# Patient Record
Sex: Female | Born: 2005 | Race: White | Hispanic: No | Marital: Single | Smoking: Never smoker
Health system: Southern US, Community
[De-identification: ages and names within clinical notes are randomized; demographics above are authoritative.]

---
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
## 2023-10-04 ENCOUNTER — Ambulatory Visit
Admission: EM | Admit: 2023-10-04 | Discharge: 2023-10-04 | Disposition: A | Discharge status: Home / Self Care | Attending: Nurse Practitioner | Admitting: Nurse Practitioner

## 2023-10-04 ENCOUNTER — Other Ambulatory Visit: Payer: Self-pay

## 2023-10-04 ENCOUNTER — Encounter: Payer: Self-pay | Admitting: Emergency Medicine

## 2023-10-04 DIAGNOSIS — E860 Dehydration: Secondary | ICD-10-CM

## 2023-10-04 DIAGNOSIS — R112 Nausea with vomiting, unspecified: Secondary | ICD-10-CM | POA: Diagnosis not present

## 2023-10-04 DIAGNOSIS — A084 Viral intestinal infection, unspecified: Secondary | ICD-10-CM | POA: Diagnosis not present

## 2023-10-04 LAB — POCT URINE PREGNANCY: Preg Test, Ur: NEGATIVE

## 2023-10-04 LAB — POCT URINALYSIS DIP (MANUAL ENTRY)
Blood, UA: NEGATIVE
Glucose, UA: NEGATIVE mg/dL
Leukocytes, UA: NEGATIVE
Nitrite, UA: NEGATIVE
Protein Ur, POC: 30 mg/dL — AB
Spec Grav, UA: >=1.03 — AB
Urobilinogen, UA: 1 U/dL
pH, UA: 6

## 2023-10-04 MED ORDER — ONDANSETRON 4 MG PO TBDP: 4.0000 mg | ORAL_TABLET | Freq: Three times a day (TID) | ORAL | 0 refills | Status: AC | PRN

## 2023-10-04 MED ORDER — SODIUM CHLORIDE 0.9 % IV BOLUS
1000.0000 mL | Freq: Once | INTRAVENOUS | Status: AC
  Administered 2023-10-04: 1000 mL via INTRAVENOUS

## 2023-10-04 MED ORDER — ONDANSETRON 4 MG PO TBDP
4.0000 mg | ORAL_TABLET | Freq: Once | ORAL | Status: AC
  Administered 2023-10-04: 4 mg via ORAL

## 2023-10-04 NOTE — ED Triage Notes (Signed)
 Pt here for upper abd pain with radiation into back starting this am with vomiting; denies urinary sx

## 2023-10-04 NOTE — ED Provider Notes (Signed)
 [facility]    CSN: [id number] Arrival date & time: 10/04/23  1938      History   Chief Complaint Chief Complaint  Patient presents with   Abdominal Pain    HPI Natalie Christian is a 18 y.o. female.   Patient presents today with 24-hour history of persistent nausea and vomiting.  Reports that she has been unable to tolerate any oral intake for the past 12 hours and as result has had decreased urinary output.  She denies any urinary symptoms including frequency, urgency, hematuria.  She does report that her uncle was sick with similar symptoms several days before.  Denies additional sick contacts.  Denies any recent travel.  Denies any recent medication changes or antibiotic use.  Denies any suspicious food intake.  She reports upper abdominal and upper back pain which she believes is a result of the retching/vomiting.  She reports that the pain began after she developed GI symptoms and not before.  Pain is rated 4/5 on a 0-10 pain scale, described as aching/soreness, no alleviating factors identified.  She does not take NSAIDs on a regular basis.  Reports her last bowel movement was earlier today and was normal.  Denies any associated melena, hematochezia, hematemesis.  She denies history of gastrointestinal disorder.  Denies previous abdominal surgery.    History reviewed. No pertinent past medical history.  There are no active problems to display for this patient.   History reviewed. No pertinent surgical history.  OB History   No obstetric history on file.      Home Medications    Prior to Admission medications   Medication Sig Start Date End Date Taking? Authorizing Provider  ondansetron (ZOFRAN-ODT) 4 MG disintegrating tablet Take 1 tablet (4 mg total) by mouth every 8 (eight) hours as needed for nausea or vomiting. 10/04/23  Yes Sophiea Ueda K, PA-C  escitalopram (LEXAPRO) 10 MG tablet Take 10 mg by mouth daily.    [provider]  guaiFENesin-codeine  (CHERATUSSIN AC) 100-10 MG/5ML syrup Take 5 mLs by mouth 4 (four) times daily as needed for cough. 09/19/21   Ann Keto, MD  methocarbamol  (ROBAXIN ) 500 MG tablet Take 1 tablet (500 mg total) by mouth 2 (two) times daily as needed for muscle spasms. 08/13/22   Dodson Freestone, FNP    Family History History reviewed. No pertinent family history.  Social History Social History   Tobacco Use   Smoking status: Never   Smokeless tobacco: Never  Vaping Use   Vaping status: Never Used  Substance Use Topics   Alcohol use: Never   Drug use: Never     Allergies   Patient has no known allergies.   Review of Systems Review of Systems  Constitutional:  Positive for activity change. Negative for appetite change, fatigue and fever.  HENT:  Negative for congestion and sore throat.   Respiratory:  Negative for shortness of breath.   Cardiovascular:  Negative for chest pain.  Gastrointestinal:  Positive for abdominal pain, nausea and vomiting. Negative for diarrhea.  Musculoskeletal:  Positive for back pain. Negative for arthralgias and myalgias.  Neurological:  Negative for dizziness, light-headedness and headaches.     Physical Exam Triage Vital Signs ED Triage Vitals  Encounter Vitals Group     BP 10/04/23 2015 115/75     Systolic BP Percentile --      Diastolic BP Percentile --      Pulse Rate 10/04/23 2015 (!) 120  Resp 10/04/23 2015 18     Temp 10/04/23 2015 98.6 F (37 C)     Temp Source 10/04/23 2015 Oral     SpO2 10/04/23 2015 97 %     Weight --      Height --      Head Circumference --      Peak Flow --      Pain Score 10/04/23 2016 7     Pain Loc --      Pain Education --      Exclude from Growth Chart --    No data found.  Updated Vital Signs BP 115/75 (BP Location: Right Arm)   Pulse (!) 105   Temp 98.6 F (37 C) (Oral)   Resp 18   SpO2 97%   Visual Acuity Right Eye Distance:   Left Eye Distance:   Bilateral Distance:    Right Eye Near:    Left Eye Near:    Bilateral Near:     Physical Exam Vitals reviewed.  Constitutional:      General: She is awake. She is not in acute distress.    Appearance: Normal appearance. She is well-developed. She is not ill-appearing.     Comments: Very pleasant female appears stated age in no acute distress sitting comfortably in exam room holding emesis bag  HENT:     Head: Normocephalic and atraumatic.     Mouth/Throat:     Mouth: Mucous membranes are moist.     Pharynx: No oropharyngeal exudate or posterior oropharyngeal erythema.  Cardiovascular:     Rate and Rhythm: Regular rhythm. Tachycardia present.     Heart sounds: Normal heart sounds, S1 normal and S2 normal. No murmur heard. Pulmonary:     Effort: Pulmonary effort is normal.     Breath sounds: Normal breath sounds. No wheezing, rhonchi or rales.     Comments: Clear to auscultation bilaterally Abdominal:     General: Bowel sounds are normal.     Palpations: Abdomen is soft.     Tenderness: There is abdominal tenderness in the right upper quadrant, epigastric area and left upper quadrant. There is no right CVA tenderness, left CVA tenderness, guarding or rebound. Negative signs include Murphy's sign.     Comments: Mild tenderness palpation throughout upper abdomen.  No evidence of acute abdomen on physical exam.  Negative Murphy sign.  No focal tenderness.  Psychiatric:        Behavior: Behavior is cooperative.      UC Treatments / Results  Labs (all labs ordered are listed, but only abnormal results are displayed) Labs Reviewed  POCT URINALYSIS DIP (MANUAL ENTRY) - Abnormal; Notable for the following components:      Result Value   Clarity, UA cloudy (*)    Bilirubin, UA small (*)    Ketones, POC UA moderate (40) (*)    Spec Grav, UA >=1.030 (*)    Protein Ur, POC =30 (*)    All other components within normal limits  POCT URINE PREGNANCY - Normal  CBC WITH DIFFERENTIAL/PLATELET  COMPREHENSIVE METABOLIC PANEL WITH  GFR  LIPASE    EKG   Radiology No results found.  Procedures Procedures (including critical care time)  Medications Ordered in UC Medications  ondansetron (ZOFRAN-ODT) disintegrating tablet 4 mg (4 mg Oral Given 10/04/23 2042)  sodium chloride 0.9 % bolus 1,000 mL (1,000 mLs Intravenous New Bag/Given 10/04/23 2049)    Initial Impression / Assessment and Plan / UC Course  I have reviewed  the triage vital signs and the nursing notes.  Pertinent labs & imaging results that were available during my care of the patient were reviewed by me and considered in my medical decision making (see chart for details).     Patient was well-appearing, afebrile, nontoxic.  She was significantly tachycardic but noted to be dehydrated based on her UA.  She was given 1 L of saline in clinic with improvement of symptoms normalization of her heart rate.  Urine pregnancy was negative.  Suspect gastroenteritis as etiology of symptoms given short duration.  She was given Zofran and able to pass oral challenge following rehydration therapy in clinic.  She was sent home with this medication to be used on a scheduled basis for the next several days and then decrease use to as needed thereafter.  Recommend she eat a bland diet and drink plenty of fluid.  Recommended close follow-up with her primary care ideally within the next few days to ensure that she is continuing to improve.  Basic blood work including lipase, CBC,, CMP obtained and are pending.  We will contact her if these are abnormal and change our treatment plan.  We discussed that if anything worsen she should have a low threshold for going to the hospital including severe abdominal pain, fever, nausea/vomiting interfering with oral intake, nausea/vomiting despite the medication, melena/hematochezia she needs to be seen immediately.  Strict return precautions given.  Excuse note provided.  Final Clinical Impressions(s) / UC Diagnoses   Final diagnoses:   Nausea and vomiting, unspecified vomiting type  Dehydration  Viral gastroenteritis     Discharge Instructions      You were significantly dehydrated but we were able to give you a bag of fluids.  It is very important that you use Zofran on a scheduled basis for the next 2 days to ensure that you are able to eat and drink normally.  After that you can use it every 8 hours as needed.  Eat a bland diet such as the brat diet (bananas, rice, applesauce, toast) and make sure that you are drinking plenty of fluid.  I will contact you if any of your blood work is abnormal.  Follow-up with your primary care within the next few days for recheck.  If anything worsens you have severe abdominal pain, recurrent nausea/vomiting despite the medication, blood in your stool, blood in your vomit, weakness you need to go to the emergency room immediately.   ED Prescriptions     Medication Sig Dispense Auth. Provider   ondansetron (ZOFRAN-ODT) 4 MG disintegrating tablet Take 1 tablet (4 mg total) by mouth every 8 (eight) hours as needed for nausea or vomiting. 20 tablet Nusayba Cadenas K, PA-C      PDMP not reviewed this encounter.   Budd Cargo, PA-C 10/04/23 2122

## 2023-10-04 NOTE — Discharge Instructions (Signed)
 You were significantly dehydrated but we were able to give you a bag of fluids.  It is very important that you use Zofran on a scheduled basis for the next 2 days to ensure that you are able to eat and drink normally.  After that you can use it every 8 hours as needed.  Eat a bland diet such as the brat diet (bananas, rice, applesauce, toast) and make sure that you are drinking plenty of fluid.  I will contact you if any of your blood work is abnormal.  Follow-up with your primary care within the next few days for recheck.  If anything worsens you have severe abdominal pain, recurrent nausea/vomiting despite the medication, blood in your stool, blood in your vomit, weakness you need to go to the emergency room immediately.

## 2023-10-06 ENCOUNTER — Ambulatory Visit (HOSPITAL_COMMUNITY): Payer: Self-pay

## 2023-10-06 LAB — COMPREHENSIVE METABOLIC PANEL WITH GFR
ALT: 16 IU/L (ref 0–32)
AST: 17 IU/L (ref 0–40)
Albumin: 4.9 g/dL (ref 4.0–5.0)
Alkaline Phosphatase: 91 IU/L (ref 42–106)
BUN/Creatinine Ratio: 19 (ref 9–23)
BUN: 12 mg/dL (ref 6–20)
Bilirubin Total: 0.6 mg/dL (ref 0.0–1.2)
CO2: 21 mmol/L (ref 20–29)
Calcium: 9.7 mg/dL (ref 8.7–10.2)
Chloride: 102 mmol/L (ref 96–106)
Creatinine, Ser: 0.63 mg/dL (ref 0.57–1.00)
Globulin, Total: 2.5 g/dL (ref 1.5–4.5)
Glucose: 94 mg/dL (ref 70–99)
Potassium: 4.1 mmol/L (ref 3.5–5.2)
Sodium: 139 mmol/L (ref 134–144)
Total Protein: 7.4 g/dL (ref 6.0–8.5)
eGFR: 131 mL/min/{1.73_m2} (ref >59)

## 2023-10-06 LAB — CBC WITH DIFFERENTIAL/PLATELET
Basophils Absolute: 0 10*3/uL (ref 0.0–0.2)
Basos: 0 %
EOS (ABSOLUTE): 0 10*3/uL (ref 0.0–0.4)
Eos: 0 %
Hematocrit: 44.5 % (ref 34.0–46.6)
Hemoglobin: 13.9 g/dL (ref 11.1–15.9)
Immature Grans (Abs): 0 10*3/uL (ref 0.0–0.1)
Immature Granulocytes: 0 %
Lymphocytes Absolute: 0.4 10*3/uL — ABNORMAL LOW (ref 0.7–3.1)
Lymphs: 4 %
MCH: 29.5 pg (ref 26.6–33.0)
MCHC: 31.2 g/dL — ABNORMAL LOW (ref 31.5–35.7)
MCV: 95 fL (ref 79–97)
Monocytes Absolute: 0.6 10*3/uL (ref 0.1–0.9)
Monocytes: 5 %
Neutrophils Absolute: 11 10*3/uL — ABNORMAL HIGH (ref 1.4–7.0)
Neutrophils: 91 %
Platelets: 311 10*3/uL (ref 150–450)
RBC: 4.71 x10E6/uL (ref 3.77–5.28)
RDW: 11.9 % (ref 11.7–15.4)
WBC: 12.2 10*3/uL — ABNORMAL HIGH (ref 3.4–10.8)

## 2023-10-06 LAB — LIPASE: Lipase: 19 U/L (ref 14–72)

## 2024-02-23 IMAGING — DX DG CHEST 2V
2 series · 2 of 2 positions shown · non-contrast
Comparison: 04/18/2014

CLINICAL DATA: Cough.  URI.  Congestion and dyspnea.

EXAM:
CHEST - 2 VIEW

[chest pa]
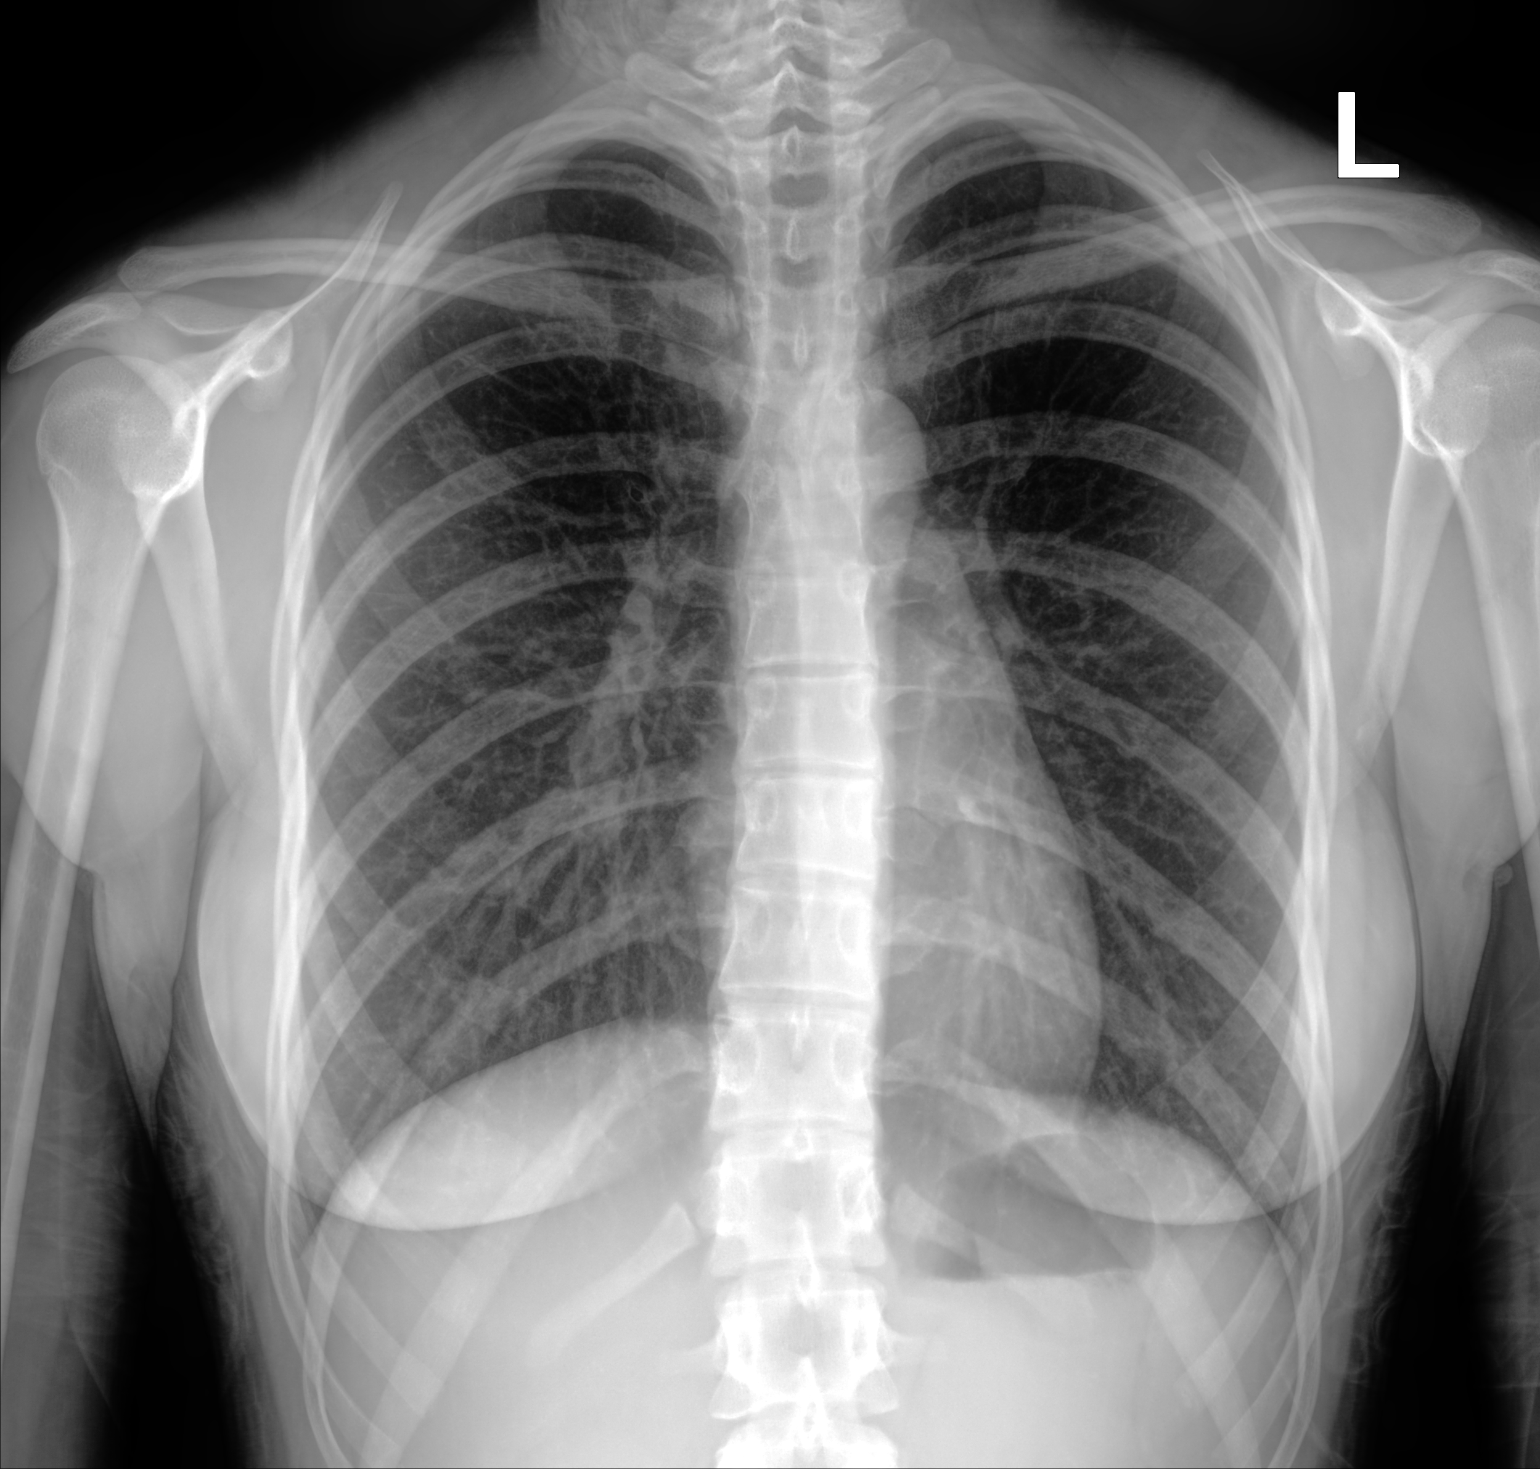

[chest lat]
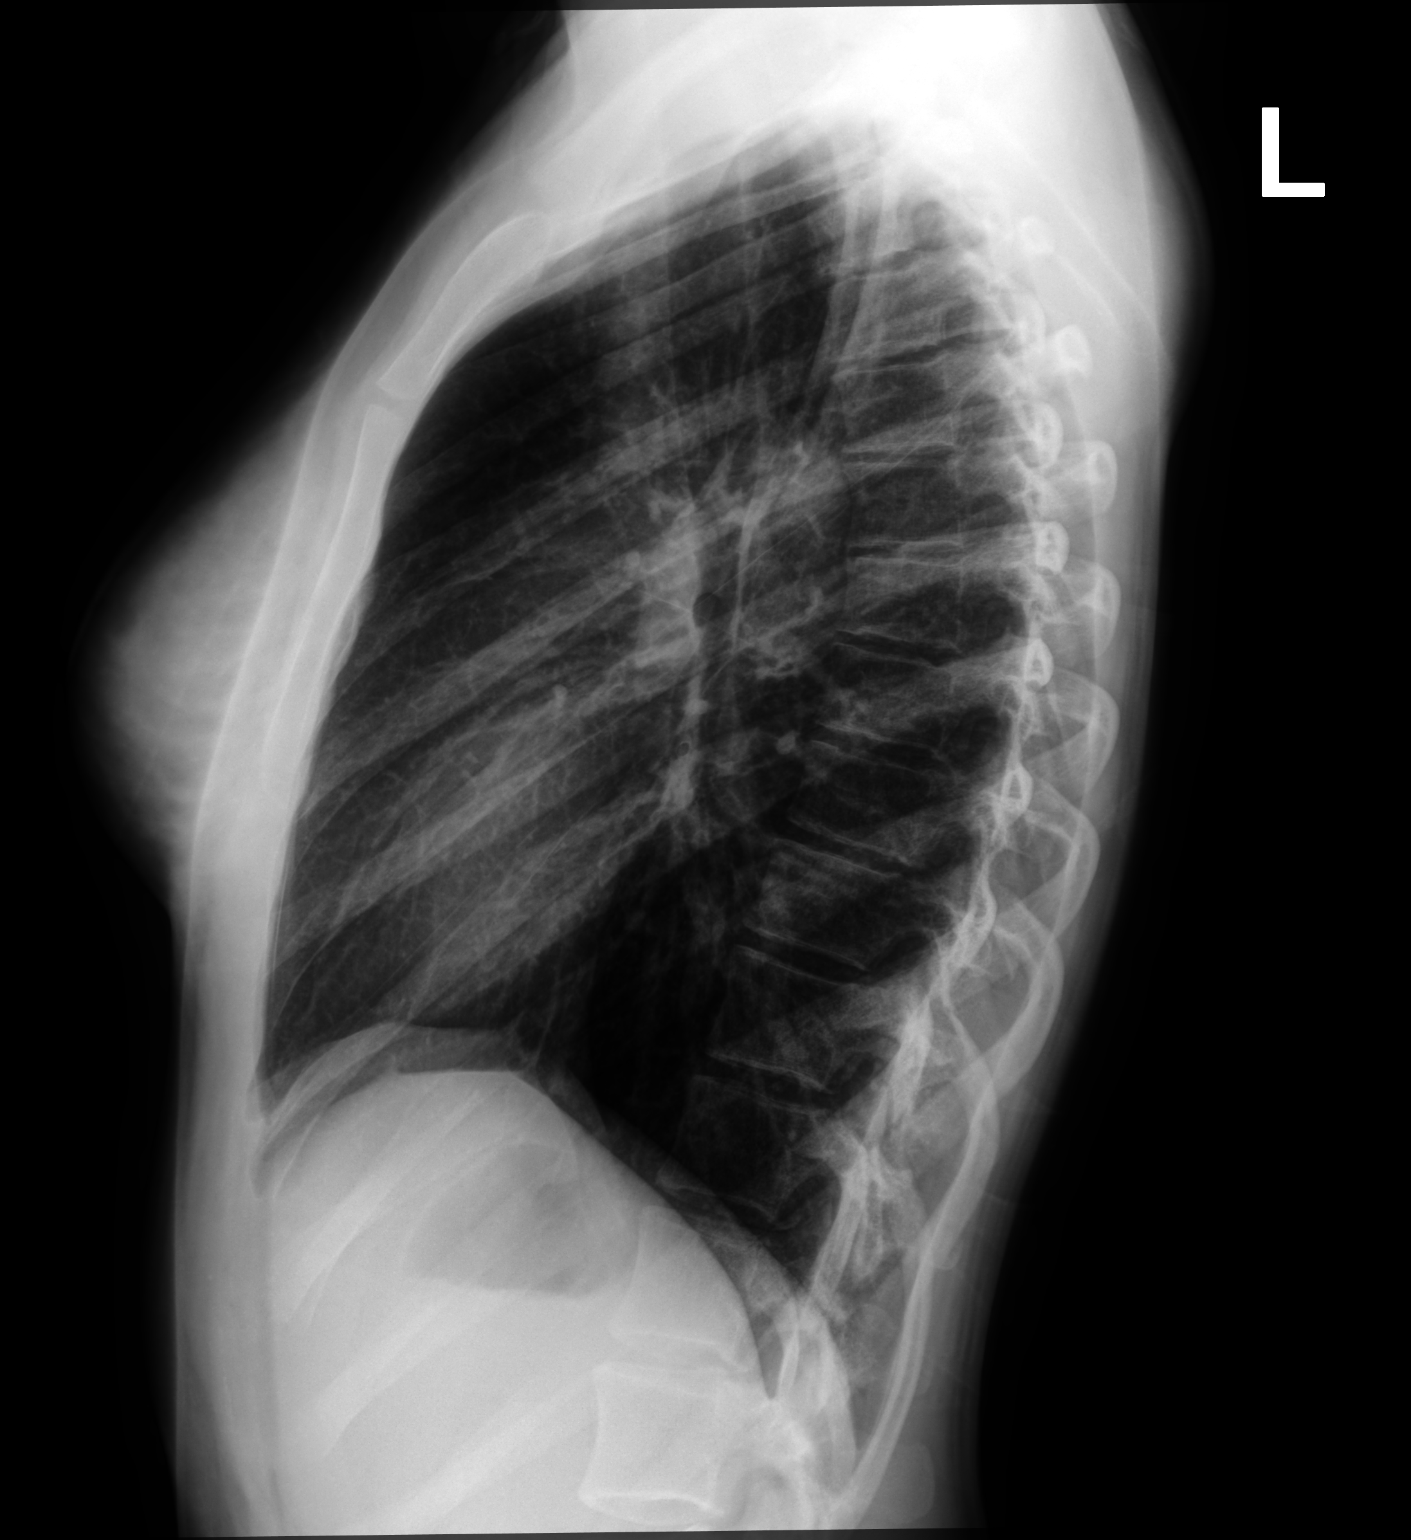

[2 of 2 positions shown; findings below may reference images not displayed]

FINDINGS: The heart size and mediastinal contours are within normal limits.
Both lungs are clear. The visualized skeletal structures are
unremarkable.
IMPRESSION: No active cardiopulmonary disease.
